# Patient Record
Sex: Male | Born: 1970 | Race: White | Hispanic: No | Marital: Married | Smoking: Never smoker
Health system: Southern US, Community
[De-identification: ages and names within clinical notes are randomized; demographics above are authoritative.]

---

## 1998-07-10 ENCOUNTER — Emergency Department (HOSPITAL_COMMUNITY): Admission: EM | Admit: 1998-07-10 | Discharge: 1998-07-11 | Payer: Self-pay | Admitting: Emergency Medicine

## 2013-08-20 ENCOUNTER — Encounter: Payer: Self-pay | Admitting: Family Medicine

## 2013-08-20 ENCOUNTER — Ambulatory Visit (INDEPENDENT_AMBULATORY_CARE_PROVIDER_SITE_OTHER): Payer: Managed Care, Other (non HMO) | Admitting: Family Medicine

## 2013-08-20 VITALS — BP 116/72 | HR 58 | Temp 98.4°F | Ht 70.75 in | Wt 166.5 lb

## 2013-08-20 DIAGNOSIS — Z119 Encounter for screening for infectious and parasitic diseases, unspecified: Secondary | ICD-10-CM

## 2013-08-20 DIAGNOSIS — Z0000 Encounter for general adult medical examination without abnormal findings: Secondary | ICD-10-CM

## 2013-08-20 NOTE — Patient Instructions (Addendum)
Labs today  Stay healthy- take care of yourself  Consider a flu shot this fall

## 2013-08-20 NOTE — Progress Notes (Signed)
  Subjective:    Patient ID: Luis Richard, male    DOB: [birth year], 42 y.o.   MRN: [id number]  HPI Here to get established as a new patient  Lives in [place] -   Needs a PE for adoption of a child In the process of adoption from [place]  Is upgrading to a new form   In the process   Non smoker Healthy diet  Some exercise  Works at [employer]   Last Tdap 07  Declines flu shot    No ongoing health problems   Cholesterol profile 2011 LDL 88, HDL 53 - very good profile   Patient Active Problem List   Diagnosis Date Noted  . Routine general medical examination at a health care facility 08/20/2013  . Screening examination for infectious disease 08/20/2013   No past medical history on file. No past surgical history on file. History  Substance Use Topics  . Smoking status: Never Smoker   . Smokeless tobacco: Not on file  . Alcohol Use: Yes     Comment: occ   Family History  Problem Relation Age of Onset  . Arthritis Maternal Grandmother   . Arthritis Paternal Grandmother   . Hyperlipidemia Other     thinks both sets had it but not sure  . Diabetes Other     a few relatives   No Known Allergies No current outpatient prescriptions on file prior to visit.   No current facility-administered medications on file prior to visit.    Review of Systems Review of Systems  Constitutional: Negative for fever, appetite change, fatigue and unexpected weight change.  Eyes: Negative for pain and visual disturbance.  Respiratory: Negative for cough and shortness of breath.   Cardiovascular: Negative for cp or palpitations    Gastrointestinal: Negative for nausea, diarrhea and constipation.  Genitourinary: Negative for urgency and frequency.  Skin: Negative for pallor or rash   Neurological: Negative for weakness, light-headedness, numbness and headaches.  Hematological: Negative for adenopathy. Does not bruise/bleed easily.   Psychiatric/Behavioral: Negative for dysphoric mood. The patient is not nervous/anxious.         Objective:   Physical Exam  Constitutional: He appears well-developed and well-nourished. No distress.  HENT:  Head: Normocephalic and atraumatic.  Right Ear: External ear normal.  Left Ear: External ear normal.  Nose: Nose normal.  Mouth/Throat: Oropharynx is clear and moist.  Eyes: Conjunctivae and EOM are normal. Pupils are equal, round, and reactive to light. Right eye exhibits no discharge. Left eye exhibits no discharge. No scleral icterus.  Neck: Normal range of motion. Neck supple. No JVD present. Carotid bruit is not present. No thyromegaly present.  Cardiovascular: Normal rate, regular rhythm, normal heart sounds and intact distal pulses.  Exam reveals no gallop.   Pulmonary/Chest: Effort normal and breath sounds normal. No respiratory distress. He has no wheezes. He exhibits no tenderness.  Abdominal: Soft. Bowel sounds are normal. He exhibits no distension, no abdominal bruit and no mass. There is no tenderness.  Musculoskeletal: He exhibits no edema and no tenderness.  Lymphadenopathy:    He has no cervical adenopathy.  Neurological: He is alert. He has normal reflexes. No cranial nerve deficit. He exhibits normal muscle tone. Coordination normal.  Skin: Skin is warm and dry. No rash noted. No erythema. No pallor.  Psychiatric: He has a normal mood and affect.          Assessment & Plan:

## 2013-08-21 LAB — CBC WITH DIFFERENTIAL/PLATELET
Basophils Absolute: 0 10*3/uL (ref 0.0–0.1)
HCT: 44.2 % (ref 39.0–52.0)
Hemoglobin: 14.7 g/dL (ref 13.0–17.0)
Lymphs Abs: 1.6 10*3/uL (ref 0.7–4.0)
MCHC: 33.2 g/dL (ref 30.0–36.0)
MCV: 86.4 fl (ref 78.0–100.0)
Monocytes Absolute: 0.5 10*3/uL (ref 0.1–1.0)
Monocytes Relative: 8.5 % (ref 3.0–12.0)
Neutro Abs: 3.5 10*3/uL (ref 1.4–7.7)
RDW: 13 % (ref 11.5–14.6)

## 2013-08-21 NOTE — Assessment & Plan Note (Signed)
Required for overseas adoption program  Hep B and HIV

## 2013-08-21 NOTE — Assessment & Plan Note (Signed)
Reviewed health habits including diet and exercise and skin cancer prevention Also reviewed health mt list, fam hx and immunizations  Wellness labs today  Filled out form for overseas adoption program

## 2013-08-22 LAB — LIPID PANEL
HDL: 52.6 mg/dL (ref >39.00)
Total CHOL/HDL Ratio: 3
Triglycerides: 54 mg/dL (ref 0.0–149.0)

## 2013-08-22 LAB — COMPREHENSIVE METABOLIC PANEL
ALT: 16 U/L (ref 0–53)
CO2: 29 mEq/L (ref 19–32)
Creatinine, Ser: 1.1 mg/dL (ref 0.4–1.5)
GFR: 82.34 mL/min (ref >60.00)
Total Bilirubin: 0.8 mg/dL (ref 0.3–1.2)

## 2013-08-22 LAB — TSH: TSH: 1.93 u[IU]/mL (ref 0.35–5.50)

## 2013-08-30 ENCOUNTER — Encounter: Payer: Self-pay | Admitting: Family Medicine

## 2014-02-11 ENCOUNTER — Encounter: Payer: Self-pay | Admitting: Family Medicine

## 2014-03-10 ENCOUNTER — Telehealth: Payer: Self-pay | Admitting: Family Medicine

## 2014-03-10 NOTE — Telephone Encounter (Signed)
Pt also left vm on triage phone requesting the same as below.

## 2014-03-10 NOTE — Telephone Encounter (Signed)
Pt called and is trying to adopt a child from [place] of which he says you are aware.  He needs a Twin Rix (Hep A & B combo) vaccine and wants to know if he could get that here at this office.  Please advise. Thank you.

## 2014-03-10 NOTE — Telephone Encounter (Signed)
[facility] do have [facility]rix[facility], appt scheduled for tomorrow

## 2014-03-10 NOTE — Telephone Encounter (Signed)
As long as we are allowed to give it to adults ([facility] that is fine - make a nurse appt

## 2014-03-11 ENCOUNTER — Ambulatory Visit (INDEPENDENT_AMBULATORY_CARE_PROVIDER_SITE_OTHER): Payer: Managed Care, Other (non HMO) | Admitting: *Deleted

## 2014-03-11 DIAGNOSIS — Z23 Encounter for immunization: Secondary | ICD-10-CM

## 2014-04-15 ENCOUNTER — Ambulatory Visit: Payer: Managed Care, Other (non HMO)

## 2014-06-05 ENCOUNTER — Telehealth: Payer: Self-pay

## 2014-06-05 NOTE — Telephone Encounter (Signed)
Per Geraldine Contrasee pt doesn't need to restart the series he can just get his next vaccine asap

## 2014-06-05 NOTE — Telephone Encounter (Signed)
I think he needs to start the series over - [facility]

## 2014-06-05 NOTE — Telephone Encounter (Signed)
Pt received first Twinrix 03/11/14 and pt missed appt for second twinrix. Pt wants to know if can get 2nd twinrix this month; should have had between 1-2 months after first immunization. Pt request cb.

## 2014-06-08 NOTE — Telephone Encounter (Signed)
Nurse appt scheduled for 2nd twinrix

## 2014-06-08 NOTE — Telephone Encounter (Signed)
Pt left v/m requesting cb about twinrix.

## 2014-06-11 ENCOUNTER — Ambulatory Visit (INDEPENDENT_AMBULATORY_CARE_PROVIDER_SITE_OTHER): Payer: Managed Care, Other (non HMO)

## 2014-06-11 DIAGNOSIS — Z23 Encounter for immunization: Secondary | ICD-10-CM

## 2014-09-28 ENCOUNTER — Emergency Department (INDEPENDENT_AMBULATORY_CARE_PROVIDER_SITE_OTHER)
Admission: EM | Admit: 2014-09-28 | Discharge: 2014-09-28 | Disposition: A | Discharge status: Home / Self Care | Payer: Worker's Compensation | Source: Home / Self Care

## 2014-09-28 ENCOUNTER — Encounter (HOSPITAL_COMMUNITY): Payer: Self-pay | Admitting: Emergency Medicine

## 2014-09-28 DIAGNOSIS — S0181XA Laceration without foreign body of other part of head, initial encounter: Secondary | ICD-10-CM

## 2014-09-28 MED ORDER — LIDOCAINE-EPINEPHRINE (PF) 2 %-1:200000 IJ SOLN
INTRAMUSCULAR | Status: AC
  Filled 2014-09-28: qty 20

## 2014-09-28 NOTE — ED Provider Notes (Signed)
CSN: [id number]     Arrival date & time 09/28/14  1853 History   First MD Initiated Contact with Patient 09/28/14 1933     Chief Complaint  Patient presents with  . Facial Laceration   (Consider location/radiation/quality/duration/timing/severity/associated sxs/prior Treatment) HPI Comments: Struck on L chin by a bolt at 2PM today. C/O laceration only. No problems with mandible function. No involvement to eyes, mouth, nose.   History reviewed. No pertinent past medical history. History reviewed. No pertinent past surgical history. Family History  Problem Relation Age of Onset  . Arthritis Maternal Grandmother   . Arthritis Paternal Grandmother   . Hyperlipidemia Other     thinks both sets had it but not sure  . Diabetes Other     a few relatives   History  Substance Use Topics  . Smoking status: Never Smoker   . Smokeless tobacco: Not on file  . Alcohol Use: No     Comment: occ    Review of Systems  Skin: Positive for wound.  All other systems reviewed and are negative.   Allergies  Review of patient's allergies indicates no known allergies.  Home Medications   Prior to Admission medications   Not on File   BP 149/79  Pulse 76  Temp(Src) 98.5 F (36.9 C) (Oral)  Resp 14  SpO2 100% Physical Exam  Nursing note and vitals reviewed. Constitutional: He is oriented to person, place, and time. He appears well-developed and well-nourished. No distress.  Neck:    Laceration diagram.  Musculoskeletal: Normal range of motion. He exhibits no edema.  Neurological: He is alert and oriented to person, place, and time. He exhibits normal muscle tone.  Skin: Skin is warm and dry.  Superficial laceration to right side of chin, angled, with a total length of 1 cm.  Psychiatric: He has a normal mood and affect.    ED Course  LACERATION REPAIR Date/Time: 09/28/2014 8:06 PM Performed by: Phineas RealMABE, Manan Olmo Authorized by: Clementeen GrahamOREY, EVAN, S Consent: Verbal consent obtained. Consent  given by: patient Patient understanding: patient states understanding of the procedure being performed Patient identity confirmed: verbally with patient Body area: head/neck Location details: right cheek Laceration length: 1 cm Foreign bodies: no foreign bodies Tendon involvement: none Nerve involvement: none Vascular damage: no Anesthesia: local infiltration Local anesthetic: lidocaine 2% with epinephrine Anesthetic total: 3 ml Patient sedated: no Preparation: Patient was prepped and draped in the usual sterile fashion. Irrigation solution: saline Irrigation method: syringe Amount of cleaning: standard Debridement: none Degree of undermining: none Skin closure: 5-0 Prolene Technique: simple Approximation: close Approximation difficulty: simple   (including critical care time) Labs Review Labs Reviewed - No data to display  Imaging Review No results found.   MDM   1. Laceration of face without complication, initial encounter    Laceration repair as above RTO 5 d for suture removal, sooner for problems, infection    Hayden Rasmussenavid Uchechi Denison, NP 09/28/14 2009

## 2014-09-28 NOTE — ED Notes (Addendum)
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 Has aprox 1 x 2 "L" shaped laceration , no other injury . Ate meal after injury and was able to chew . Denies LOC. Bleeding controlled. States his last tetanus was ~3 years ago

## 2014-09-28 NOTE — Discharge Instructions (Signed)
Facial Laceration ° A facial laceration is a cut on the face. These injuries can be painful and cause bleeding. Lacerations usually heal quickly, but they need special care to reduce scarring. °DIAGNOSIS  °Your health care provider will take a medical history, ask for details about how the injury occurred, and examine the wound to determine how deep the cut is. °TREATMENT  °Some facial lacerations may not require closure. Others may not be able to be closed because of an increased risk of infection. The risk of infection and the chance for successful closure will depend on various factors, including the amount of time since the injury occurred. °The wound may be cleaned to help prevent infection. If closure is appropriate, pain medicines may be given if needed. Your health care provider will use stitches (sutures), wound glue (adhesive), or skin adhesive strips to repair the laceration. These tools bring the skin edges together to allow for faster healing and a better cosmetic outcome. If needed, you may also be given a tetanus shot. °HOME CARE INSTRUCTIONS °· Only take over-the-counter or prescription medicines as directed by your health care provider. °· Follow your health care provider's instructions for wound care. These instructions will vary depending on the technique used for closing the wound. °For Sutures: °· Keep the wound clean and dry.   °· If you were given a bandage (dressing), you should change it at least once a day. Also change the dressing if it becomes wet or dirty, or as directed by your health care provider.   °· Wash the wound with soap and water 2 times a day. Rinse the wound off with water to remove all soap. Pat the wound dry with a clean towel.   °· After cleaning, apply a thin layer of the antibiotic ointment recommended by your health care provider. This will help prevent infection and keep the dressing from sticking.   °· You may shower as usual after the first 24 hours. Do not soak the  wound in water until the sutures are removed.   °· Get your sutures removed as directed by your health care provider. With facial lacerations, sutures should usually be taken out after 4-5 days to avoid stitch marks.   °· Wait a few days after your sutures are removed before applying any makeup. °For Skin Adhesive Strips: °· Keep the wound clean and dry.   °· Do not get the skin adhesive strips wet. You may bathe carefully, using caution to keep the wound dry.   °· If the wound gets wet, pat it dry with a clean towel.   °· Skin adhesive strips will fall off on their own. You may trim the strips as the wound heals. Do not remove skin adhesive strips that are still stuck to the wound. They will fall off in time.   °For Wound Adhesive: °· You may briefly wet your wound in the shower or bath. Do not soak or scrub the wound. Do not swim. Avoid periods of heavy sweating until the skin adhesive has fallen off on its own. After showering or bathing, gently pat the wound dry with a clean towel.   °· Do not apply liquid medicine, cream medicine, ointment medicine, or makeup to your wound while the skin adhesive is in place. This may loosen the film before your wound is healed.   °· If a dressing is placed over the wound, be careful not to apply tape directly over the skin adhesive. This may cause the adhesive to be pulled off before the wound is healed.   °· Avoid   prolonged exposure to sunlight or tanning lamps while the skin adhesive is in place.  The skin adhesive will usually remain in place for 5-10 days, then naturally fall off the skin. Do not pick at the adhesive film.  After Healing: Once the wound has healed, cover the wound with sunscreen during the day for 1 full year. This can help minimize scarring. Exposure to ultraviolet light in the first year will darken the scar. It can take 1-2 years for the scar to lose its redness and to heal completely.  SEEK IMMEDIATE MEDICAL CARE IF:  You have redness, pain, or  swelling around the wound.   You see ayellowish-white fluid (pus) coming from the wound.   You have chills or a fever.  MAKE SURE YOU:  Understand these instructions.  Will watch your condition.  Will get help right away if you are not doing well or get worse. Document Released: 01/18/2005 Document Revised: 10/01/2013 Document Reviewed: 07/24/2013 [facility] Patient Information 2015 [place] [place]. This information is not intended to replace advice given to you by your health care provider. Make sure you discuss any questions you have with your health care provider.  Laceration Care, Adult A laceration is a cut that goes through all layers of the skin. The cut goes into the tissue beneath the skin. HOME CARE For stitches (sutures) or staples:  Keep the cut clean and dry.  If you have a bandage (dressing), change it at least once a day. Change the bandage if it gets wet or dirty, or as told by your doctor.  Wash the cut with soap and water 2 times a day. Rinse the cut with water. Pat it dry with a clean towel.  Put a thin layer of medicated cream on the cut as told by your doctor.  You may shower after the first 24 hours. Do not soak the cut in water until the stitches are removed.  Only take medicines as told by your doctor.  Have your stitches or staples removed as told by your doctor. For skin adhesive strips:  Keep the cut clean and dry.  Do not get the strips wet. You may take a bath, but be careful to keep the cut dry.  If the cut gets wet, pat it dry with a clean towel.  The strips will fall off on their own. Do not remove the strips that are still stuck to the cut. For wound glue:  You may shower or take baths. Do not soak or scrub the cut. Do not swim. Avoid heavy sweating until the glue falls off on its own. After a shower or bath, pat the cut dry with a clean towel.  Do not put medicine on your cut until the glue falls off.  If you have a bandage, do not  put tape over the glue.  Avoid lots of sunlight or tanning lamps until the glue falls off. Put sunscreen on the cut for the first year to reduce your scar.  The glue will fall off on its own. Do not pick at the glue. You may need a tetanus shot if:  You cannot remember when you had your last tetanus shot.  You have never had a tetanus shot. If you need a tetanus shot and you choose not to have one, you may get tetanus. Sickness from tetanus can be serious. GET HELP RIGHT AWAY IF:   Your pain does not get better with medicine.  Your arm, hand, leg, or foot loses  feeling (numbness) or changes color.  Your cut is bleeding.  Your joint feels weak, or you cannot use your joint.  You have painful lumps on your body.  Your cut is red, puffy (swollen), or painful.  You have a red line on the skin near the cut.  You have yellowish-white fluid (pus) coming from the cut.  You have a fever.  You have a bad smell coming from the cut or bandage.  Your cut breaks open before or after stitches are removed.  You notice something coming out of the cut, such as wood or glass.  You cannot move a finger or toe. MAKE SURE YOU:   Understand these instructions.  Will watch your condition.  Will get help right away if you are not doing well or get worse. Document Released: 05/29/2008 Document Revised: 03/04/2012 Document Reviewed: 06/06/2011 [facility] Patient Information 2015 [place], [place]. This information is not intended to replace advice given to you by your health care provider. Make sure you discuss any questions you have with your health care provider.

## 2014-09-30 NOTE — ED Provider Notes (Signed)
Medical screening examination/treatment/procedure(s) were performed by a resident physician or non-physician practitioner and as the supervising physician I was immediately available for consultation/collaboration.  Zhyon Antenucci, MD    Sahid Borba S Lavante Toso, MD 09/30/14 0757 

## 2014-10-03 ENCOUNTER — Emergency Department (INDEPENDENT_AMBULATORY_CARE_PROVIDER_SITE_OTHER)
Admission: EM | Admit: 2014-10-03 | Discharge: 2014-10-03 | Disposition: A | Discharge status: Home / Self Care | Payer: Worker's Compensation | Source: Home / Self Care

## 2014-10-03 ENCOUNTER — Encounter (HOSPITAL_COMMUNITY): Payer: Self-pay | Admitting: Emergency Medicine

## 2014-10-03 DIAGNOSIS — Z4802 Encounter for removal of sutures: Secondary | ICD-10-CM

## 2014-10-03 DIAGNOSIS — IMO0002 Reserved for concepts with insufficient information to code with codable children: Secondary | ICD-10-CM

## 2014-10-03 NOTE — Discharge Instructions (Signed)
Wash with soap and water and apply antibiotic ointment and may use Mederma or Cocoa Butter or Shea butter.

## 2014-10-03 NOTE — ED Notes (Signed)
Patient is here for suture removal from right chin area.  sutures intact, no redness, no drainage.  Well  Healed.

## 2014-10-03 NOTE — ED Provider Notes (Signed)
  Chief Complaint   Suture / Staple Removal   History of Present Illness   Luis Richard is a 43 year old male who returns today for suture removal. He was seen here 5 days ago after a laceration sustained to the right side of the chin. He was struck by a bolt. This was repaired with 4 Prolene sutures. The wound has been healing well he returns today for suture removal.  Review of Systems   Other than as noted above, the patient denies any of the following symptoms: Systemic:  No fevers, chills, sweats, weight loss or gain, fatigue, or tiredness.  PMFSH   Past medical history, family history, social history, meds, and allergies were reviewed.    Physical Examination    Vital signs:  BP 92/57  Pulse 59  Temp(Src) 97.6 F (36.4 C) (Oral) General:  Alert and oriented.  In no distress.  Skin warm and dry. HEENT: There is a 1 cm laceration to the right chin that is close with 4 Prolene sutures and is healing well without evidence of infection.   Course in Urgent Care Center   The wound was prepped with alcohol and the 4 sutures were removed.  Assessment   The encounter diagnosis was Laceration.  No evidence of infection.  Plan   1.  Meds:  The following meds were prescribed:   New Prescriptions   No medications on file    2.  Patient Education/Counseling:  The patient was given appropriate handouts, self care instructions, and instructed in symptomatic relief.  The patient was instructed in wound care.  3.  Follow up:  The patient was told to follow up here if needed, and given some red flag symptoms such as any evidence of infection which would prompt immediate return.        Reuben Likesavid C Yareth Macdonnell, MD 10/03/14 [phone]

## 2015-02-14 ENCOUNTER — Emergency Department (INDEPENDENT_AMBULATORY_CARE_PROVIDER_SITE_OTHER): Payer: Managed Care, Other (non HMO)

## 2015-02-14 ENCOUNTER — Encounter (HOSPITAL_COMMUNITY): Payer: Self-pay | Admitting: *Deleted

## 2015-02-14 ENCOUNTER — Emergency Department (INDEPENDENT_AMBULATORY_CARE_PROVIDER_SITE_OTHER)
Admission: EM | Admit: 2015-02-14 | Discharge: 2015-02-14 | Disposition: A | Discharge status: Home / Self Care | Payer: Managed Care, Other (non HMO) | Source: Home / Self Care | Attending: Family Medicine | Admitting: Family Medicine

## 2015-02-14 DIAGNOSIS — J069 Acute upper respiratory infection, unspecified: Secondary | ICD-10-CM

## 2015-02-14 NOTE — Discharge Instructions (Signed)
Thank you for coming in today. Stop Clathromycin.  Take up to 2 aleve twice daily as needed.  Call or go to the emergency room if you get worse, have trouble breathing, have chest pains, or palpitations.    Upper Respiratory Infection, Adult An upper respiratory infection (URI) is also sometimes known as the common cold. The upper respiratory tract includes the nose, sinuses, throat, trachea, and bronchi. Bronchi are the airways leading to the lungs. Most people improve within 1 week, but symptoms can last up to 2 weeks. A residual cough may last even longer.  CAUSES Many different viruses can infect the tissues lining the upper respiratory tract. The tissues become irritated and inflamed and often become very moist. Mucus production is also common. A cold is contagious. You can easily spread the virus to others by oral contact. This includes kissing, sharing a glass, coughing, or sneezing. Touching your mouth or nose and then touching a surface, which is then touched by another person, can also spread the virus. SYMPTOMS  Symptoms typically develop 1 to 3 days after you come in contact with a cold virus. Symptoms vary from person to person. They may include:  Runny nose.  Sneezing.  Nasal congestion.  Sinus irritation.  Sore throat.  Loss of voice (laryngitis).  Cough.  Fatigue.  Muscle aches.  Loss of appetite.  Headache.  Low-grade fever. DIAGNOSIS  You might diagnose your own cold based on familiar symptoms, since most people get a cold 2 to 3 times a year. Your caregiver can confirm this based on your exam. Most importantly, your caregiver can check that your symptoms are not due to another disease such as strep throat, sinusitis, pneumonia, asthma, or epiglottitis. Blood tests, throat tests, and X-rays are not necessary to diagnose a common cold, but they may sometimes be helpful in excluding other more serious diseases. Your caregiver will decide if any further tests are  required. RISKS AND COMPLICATIONS  You may be at risk for a more severe case of the common cold if you smoke cigarettes, have chronic heart disease (such as heart failure) or lung disease (such as asthma), or if you have a weakened immune system. The very young and very old are also at risk for more serious infections. Bacterial sinusitis, middle ear infections, and bacterial pneumonia can complicate the common cold. The common cold can worsen asthma and chronic obstructive pulmonary disease (COPD). Sometimes, these complications can require emergency medical care and may be life-threatening. PREVENTION  The best way to protect against getting a cold is to practice good hygiene. Avoid oral or hand contact with people with cold symptoms. Wash your hands often if contact occurs. There is no clear evidence that vitamin C, vitamin E, echinacea, or exercise reduces the chance of developing a cold. However, it is always recommended to get plenty of rest and practice good nutrition. TREATMENT  Treatment is directed at relieving symptoms. There is no cure. Antibiotics are not effective, because the infection is caused by a virus, not by bacteria. Treatment may include:  Increased fluid intake. Sports drinks offer valuable electrolytes, sugars, and fluids.  Breathing heated mist or steam (vaporizer or shower).  Eating chicken soup or other clear broths, and maintaining good nutrition.  Getting plenty of rest.  Using gargles or lozenges for comfort.  Controlling fevers with ibuprofen or acetaminophen as directed by your caregiver.  Increasing usage of your inhaler if you have asthma. Zinc gel and zinc lozenges, taken in the first 24  hours of the common cold, can shorten the duration and lessen the severity of symptoms. Pain medicines may help with fever, muscle aches, and throat pain. A variety of non-prescription medicines are available to treat congestion and runny nose. Your caregiver can make  recommendations and may suggest nasal or lung inhalers for other symptoms.  HOME CARE INSTRUCTIONS   Only take over-the-counter or prescription medicines for pain, discomfort, or fever as directed by your caregiver.  Use a warm mist humidifier or inhale steam from a shower to increase air moisture. This may keep secretions moist and make it easier to breathe.  Drink enough water and fluids to keep your urine clear or pale yellow.  Rest as needed.  Return to work when your temperature has returned to normal or as your caregiver advises. You may need to stay home longer to avoid infecting others. You can also use a face mask and careful hand washing to prevent spread of the virus. SEEK MEDICAL CARE IF:   After the first few days, you feel you are getting worse rather than better.  You need your caregiver's advice about medicines to control symptoms.  You develop chills, worsening shortness of breath, or brown or red sputum. These may be signs of pneumonia.  You develop yellow or brown nasal discharge or pain in the face, especially when you bend forward. These may be signs of sinusitis.  You develop a fever, swollen neck glands, pain with swallowing, or white areas in the back of your throat. These may be signs of strep throat. SEEK IMMEDIATE MEDICAL CARE IF:   You have a fever.  You develop severe or persistent headache, ear pain, sinus pain, or chest pain.  You develop wheezing, a prolonged cough, cough up blood, or have a change in your usual mucus (if you have chronic lung disease).  You develop sore muscles or a stiff neck. Document Released: 06/06/2001 Document Revised: 03/04/2012 Document Reviewed: 03/18/2014 [facility] Patient Information 2015 [place], [place]. This information is not intended to replace advice given to you by your health care provider. Make sure you discuss any questions you have with your health care provider.

## 2015-02-14 NOTE — ED Provider Notes (Signed)
Luis Richard is a 44 y.o. male who presents to Urgent Care today for cough and fever present for one week. Patient called a E doctor who prescribed clarithromycin and benzoate. He is now better. He continues to have subjective fevers at home. His temperature max is 100F. He has tried also Tylenol and ibuprofen which helps a little. No vomiting or diarrhea.   History reviewed. No pertinent past medical history. History reviewed. No pertinent past surgical history. History  Substance Use Topics  . Smoking status: Never Smoker   . Smokeless tobacco: Not on file  . Alcohol Use: Yes     Comment: occasional   ROS as above Medications: No current facility-administered medications for this encounter.   Current Outpatient Prescriptions  Medication Sig Dispense Refill  . benzonatate (TESSALON) 100 MG capsule Take by mouth 3 (three) times daily as needed for cough.     No Known Allergies   Exam:  BP 106/60 mmHg  Pulse 80  Temp(Src) 98.7 F (37.1 C) (Oral)  Resp 18  SpO2 98% Gen: Well NAD HEENT: EOMI,  MMM  posterior pharynx is normal as are tympanic membranes bilaterally  Lungs: Normal work of breathing Decreased breath sounds right lung compared to left. Heart: RRR no MRG Abd: NABS, Soft. Nondistended, Nontender Exts: Brisk capillary refill, warm and well perfused.   No results found for this or any previous visit (from the past 24 hour(s)). Dg Chest 2 View  02/14/2015   CLINICAL DATA:  Cough and fever.  Taking antibiotics.  EXAM: CHEST  2 VIEW  COMPARISON:  None.  FINDINGS: Normal sized heart.  Clear lungs.  Mild to moderate scoliosis.  IMPRESSION: No acute abnormality.   Electronically Signed   By: Beckie SaltsSteven  Reid M.D.   On: 02/14/2015 10:50    Assessment and Plan: 44 y.o. male with  Viral URI. Patient is completely afebrile with minimal respiratory complaints. Plan for observation and watchful waiting with Tylenol or ibuprofen. Return as needed.  Discussed warning signs or  symptoms. Please see discharge instructions. Patient expresses understanding.     Rodolph BongEvan S Takiera Mayo, MD 02/14/15 [phone]

## 2015-02-14 NOTE — ED Notes (Signed)
Assessment per Dr. Denyse Amassorey.  Has been taking IBU, acetaminophen, and clarithromycin.

## 2015-10-08 ENCOUNTER — Encounter: Payer: Self-pay | Admitting: Internal Medicine

## 2015-10-08 ENCOUNTER — Ambulatory Visit (INDEPENDENT_AMBULATORY_CARE_PROVIDER_SITE_OTHER): Payer: Managed Care, Other (non HMO) | Admitting: Internal Medicine

## 2015-10-08 VITALS — BP 120/80 | HR 73 | Temp 97.8°F | Wt 171.0 lb

## 2015-10-08 DIAGNOSIS — J039 Acute tonsillitis, unspecified: Secondary | ICD-10-CM | POA: Diagnosis not present

## 2015-10-08 LAB — POCT RAPID STREP A (OFFICE): RAPID STREP A SCREEN: NEGATIVE

## 2015-10-08 MED ORDER — PENICILLIN V POTASSIUM 500 MG PO TABS: 500.0000 mg | ORAL_TABLET | Freq: Two times a day (BID) | ORAL | Status: DC

## 2015-10-08 NOTE — Assessment & Plan Note (Addendum)
High risk for strep despite no known exposure Rapid test negative Will treat empirically with PCN and send culture (stop if negative)

## 2015-10-08 NOTE — Addendum Note (Signed)
Addended by: Sueanne MargaritaSMITH, Mychael Smock L on: 10/08/2015 08:47 AM   Modules accepted: Orders

## 2015-10-08 NOTE — Progress Notes (Signed)
   Subjective:    Patient ID: Cathrine MusterAndrew A Enns, male    DOB: January 31, 1971, 44 y.o.   MRN: [id number]  HPI Here due to sore throat  Noticed sneezing at work about 4 days ago Started zycam Wife thought it was allergies Noticed white spots in throat yesterday Some trouble swallowing  No fever No cough No sig nasal congestion Does have some post nasal drip though Some ear pain  Using zinc melts. Taking antihistamines--?some help Ibuprofen also  No current outpatient prescriptions on file prior to visit.   No current facility-administered medications on file prior to visit.    No Known Allergies  No past medical history on file.  No past surgical history on file.  Family History  Problem Relation Age of Onset  . Arthritis Maternal Grandmother   . Arthritis Paternal Grandmother   . Hyperlipidemia Other     thinks both sets had it but not sure  . Diabetes Other     a few relatives    Social History   Social History  . Marital Status: Married    Spouse Name: N/A  . Number of Children: N/A  . Years of Education: N/A   Occupational History  . Not on file.   Social History Main Topics  . Smoking status: Never Smoker   . Smokeless tobacco: Never Used  . Alcohol Use: 0.0 oz/week    0 Standard drinks or equivalent per week     Comment: occasional  . Drug Use: No  . Sexual Activity: Not on file   Other Topics Concern  . 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 Some mouth symptoms    Objective:   Physical Exam  Constitutional: He appears well-developed and well-nourished. No distress.  HENT:  Nose: Nose normal.  No sinus tenderness TMs normal Tonsils 1+ with injection and exudates. Palatal petechiae noted  Neck: Normal range of motion. Neck supple.  Non tender left anterior cervical node  Pulmonary/Chest: Effort normal and breath sounds normal. No  respiratory distress. He has no wheezes. He has no rales.  Skin: No rash noted.          Assessment & Plan:

## 2015-10-08 NOTE — Progress Notes (Signed)
Pre visit review using our clinic review tool, if applicable. No additional management support is needed unless otherwise documented below in the visit note. 

## 2015-10-10 LAB — CULTURE, GROUP A STREP: ORGANISM ID, BACTERIA: NORMAL

## 2015-12-11 IMAGING — DX DG CHEST 2V
3 series · 3 of 3 positions shown · non-contrast
Comparison: None.

CLINICAL DATA: Cough and fever.  Taking antibiotics.

EXAM:
CHEST  2 VIEW

[chest pa]
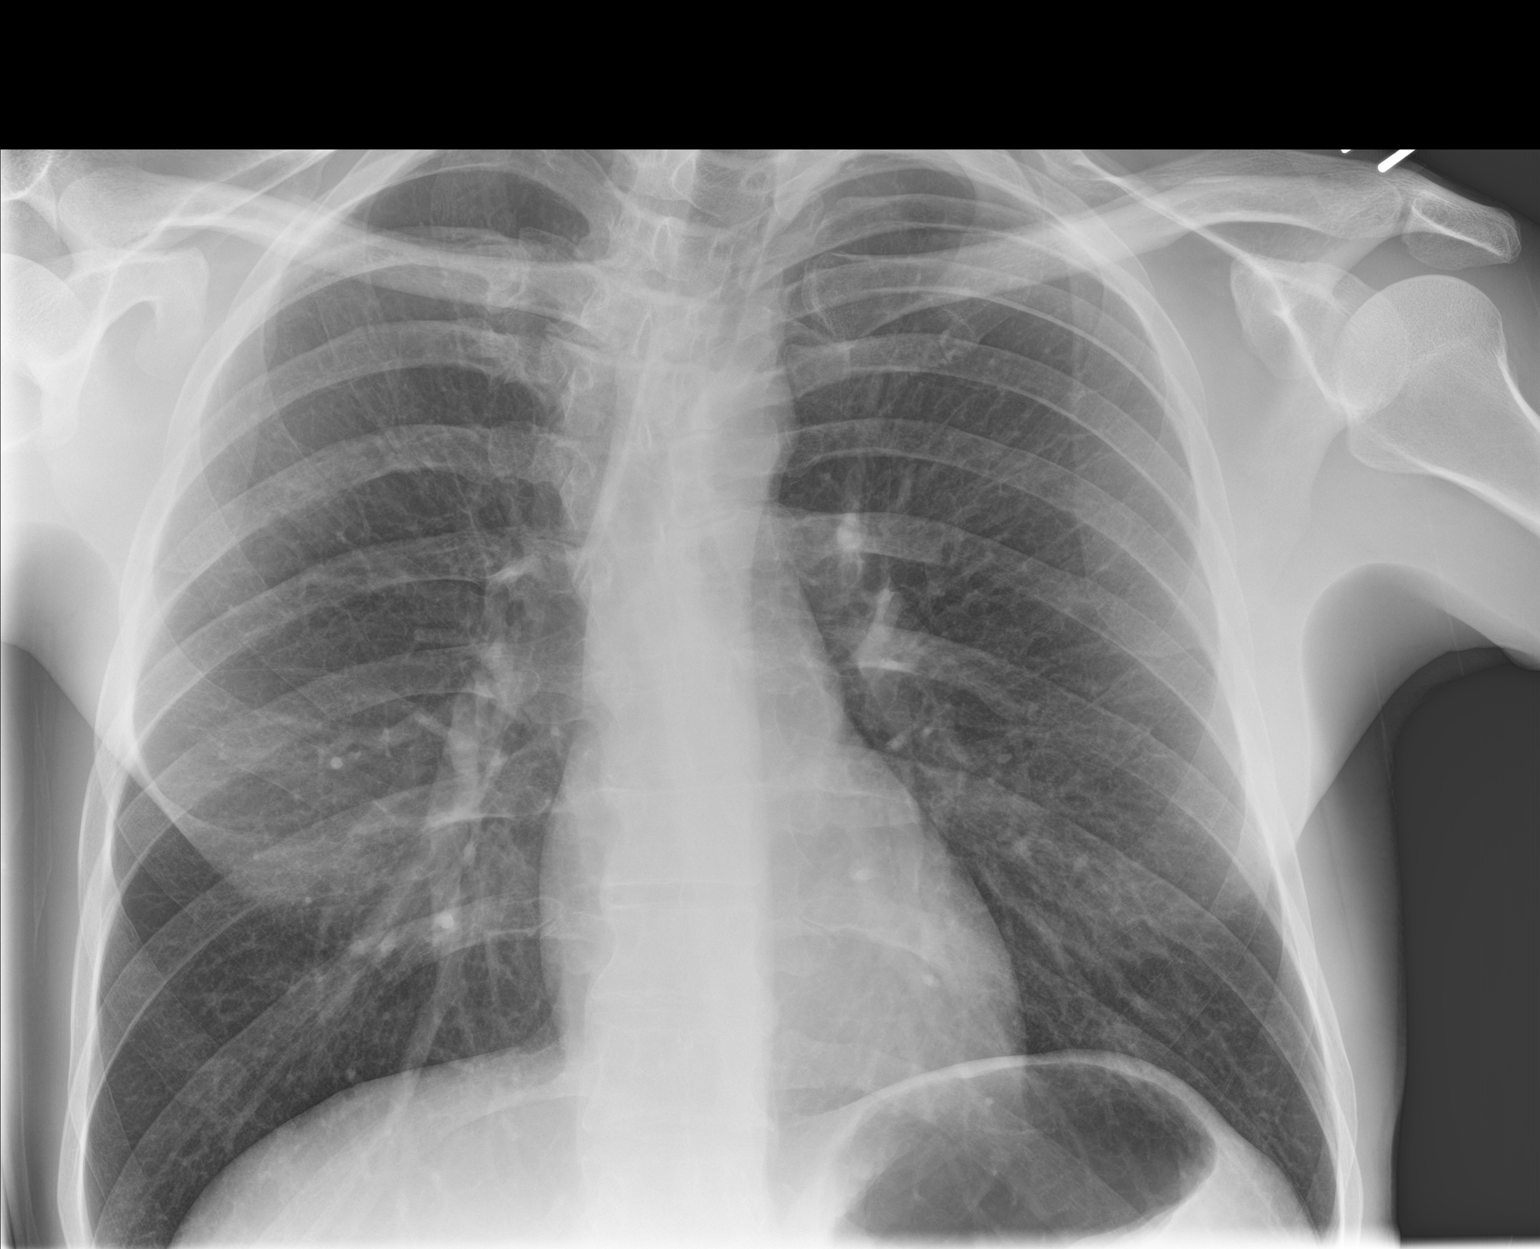

[chest lat]
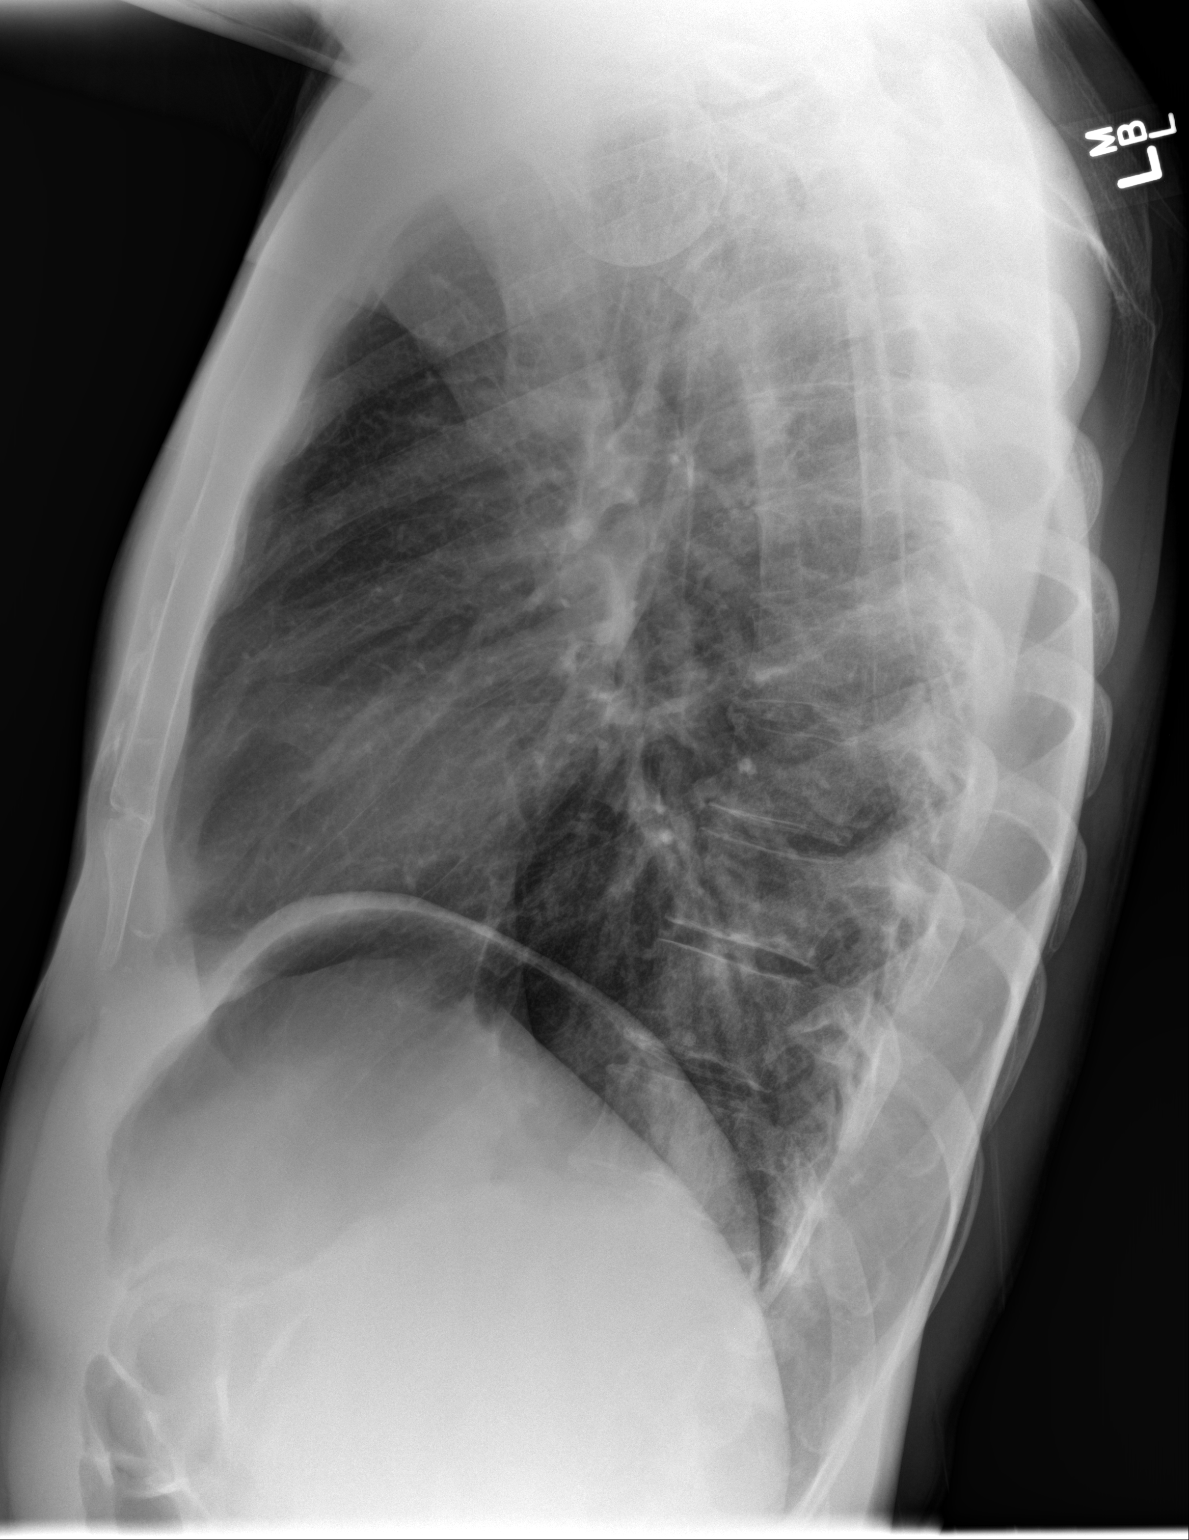

[chest ap]
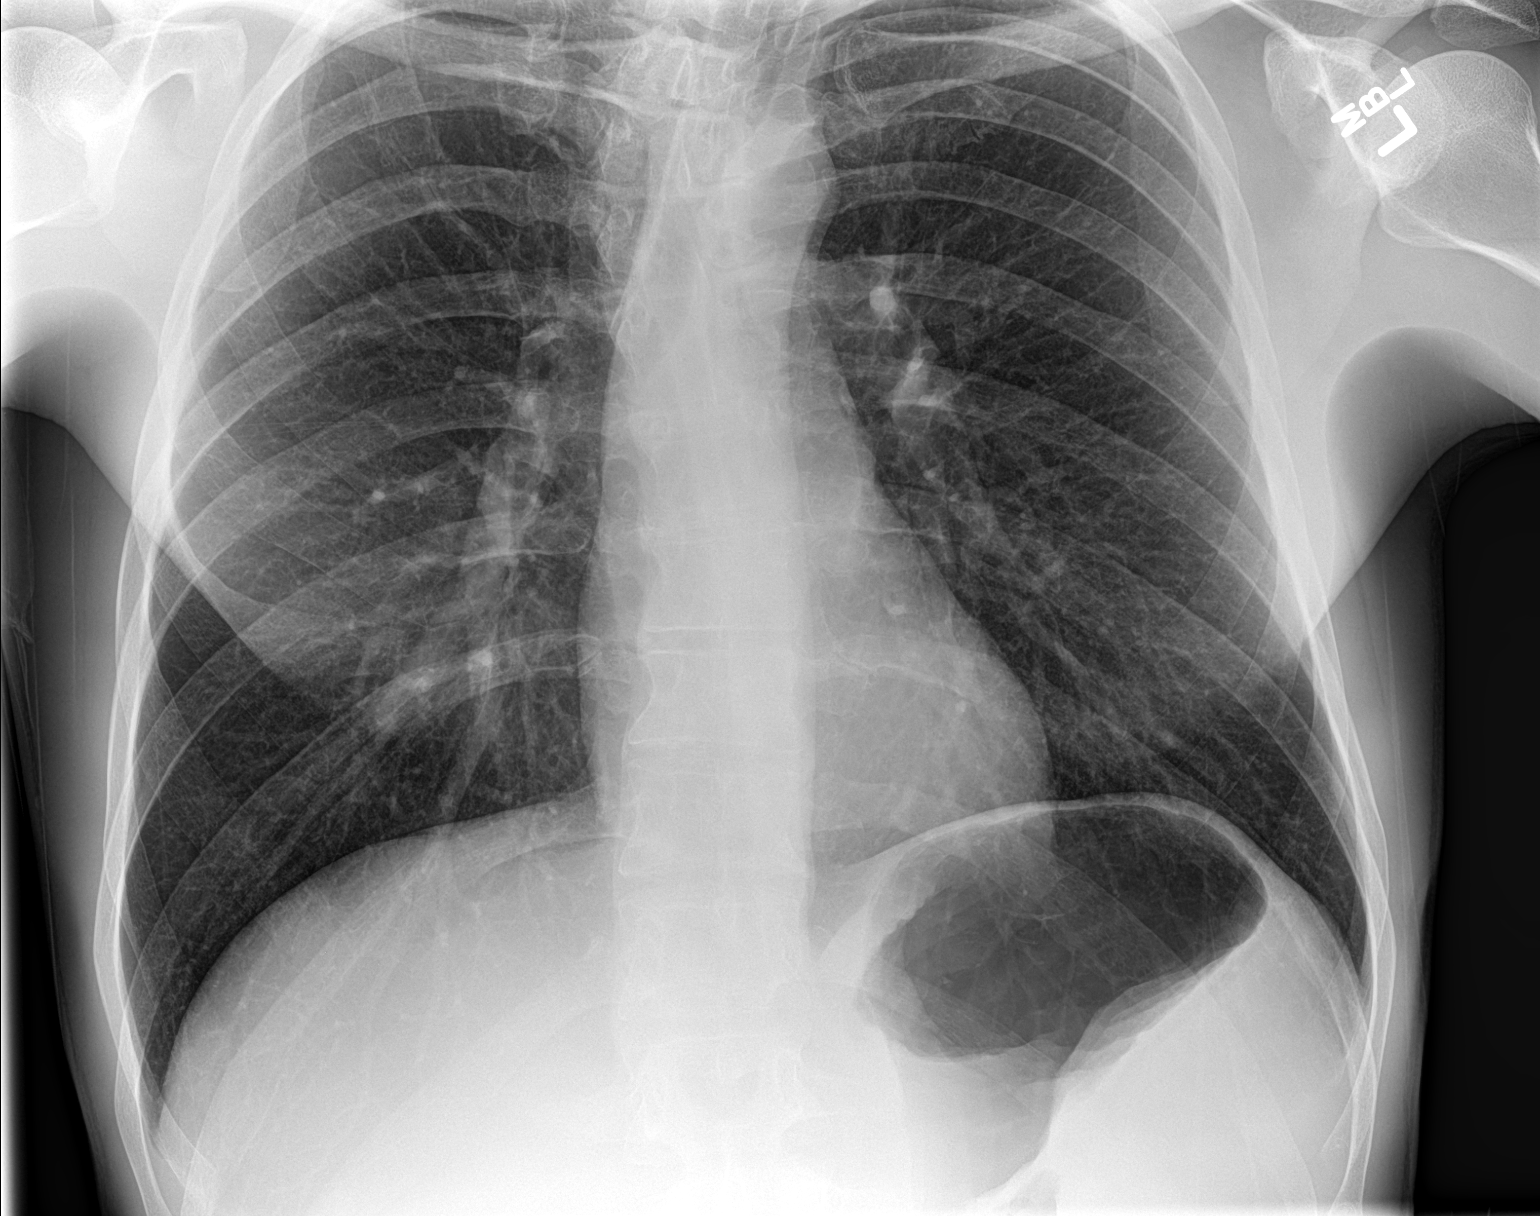

[3 of 3 positions shown; findings below may reference images not displayed]

FINDINGS: Normal sized heart.  Clear lungs.  Mild to moderate scoliosis.
IMPRESSION: No acute abnormality.

## 2016-06-23 ENCOUNTER — Encounter: Payer: Self-pay | Admitting: Primary Care

## 2016-06-23 ENCOUNTER — Ambulatory Visit (INDEPENDENT_AMBULATORY_CARE_PROVIDER_SITE_OTHER): Payer: Managed Care, Other (non HMO) | Admitting: Primary Care

## 2016-06-23 VITALS — BP 118/74 | HR 57 | Temp 98.3°F | Ht 70.75 in | Wt 175.0 lb

## 2016-06-23 DIAGNOSIS — Z13220 Encounter for screening for lipoid disorders: Secondary | ICD-10-CM

## 2016-06-23 DIAGNOSIS — Z0000 Encounter for general adult medical examination without abnormal findings: Secondary | ICD-10-CM

## 2016-06-23 LAB — COMPREHENSIVE METABOLIC PANEL
ALBUMIN: 4.3 g/dL (ref 3.5–5.2)
ALK PHOS: 45 U/L (ref 39–117)
ALT: 15 U/L (ref 0–53)
AST: 18 U/L (ref 0–37)
BILIRUBIN TOTAL: 0.9 mg/dL (ref 0.2–1.2)
BUN: 16 mg/dL (ref 6–23)
CALCIUM: 9.5 mg/dL (ref 8.4–10.5)
CO2: 33 mEq/L — ABNORMAL HIGH (ref 19–32)
Chloride: 102 mEq/L (ref 96–112)
Creatinine, Ser: 1.1 mg/dL (ref 0.40–1.50)
GFR: 77.8 mL/min (ref >60.00)
Glucose, Bld: 92 mg/dL (ref 70–99)
Potassium: 3.7 mEq/L (ref 3.5–5.1)
Sodium: 139 mEq/L (ref 135–145)
TOTAL PROTEIN: 7.3 g/dL (ref 6.0–8.3)

## 2016-06-23 LAB — LIPID PANEL
CHOLESTEROL: 153 mg/dL (ref 0–200)
HDL: 48.5 mg/dL (ref >39.00)
LDL Cholesterol: 88 mg/dL (ref 0–99)
NONHDL: 104.19
TRIGLYCERIDES: 79 mg/dL (ref 0.0–149.0)
Total CHOL/HDL Ratio: 3
VLDL: 15.8 mg/dL (ref 0.0–40.0)

## 2016-06-23 NOTE — Progress Notes (Signed)
Subjective:    Patient ID: Luis Richard, male    DOB: 08-25-1971, 45 y.o.   MRN: [id number]  HPI  Luis Richard is a [age] year old male who presents today for complete physical.  Immunizations: -Tetanus: Unsure, believes it's been within 10 years as he traveled to [place] in 2014. -Influenza: Did not complete last season.    Diet: He endorses a healthy diet. Breakfast: Oatmeal, juice, english muffin, bagel Lunch: Sandwiches, left overs Dinner: Meat, vegetable, potatoes, pasta Snacks: Occasionally, fruit, granola bar Desserts: Nightly Beverages: Orange juice, water, occasional beer  Exercise: He does not currently exercise Eye exam: Completed in 2014 Dental exam: Completed in June 2017   Review of Systems  Constitutional: Negative for unexpected weight change.  HENT: Negative for rhinorrhea.   Respiratory: Negative for cough and shortness of breath.   Cardiovascular: Negative for chest pain.  Gastrointestinal: Negative for diarrhea and constipation.  Genitourinary: Negative for difficulty urinating.       No rashes or tenderness to scrotum.  Musculoskeletal: Negative for myalgias and arthralgias.  Skin: Negative for rash.  Allergic/Immunologic: Positive for environmental allergies.  Neurological: Negative for dizziness, numbness and headaches.  Psychiatric/Behavioral:       Denies concerns for anxiety or depression       No past medical history on file.   Social History   Social History  . Marital Status: Married    Spouse Name: N/A  . Number of Children: N/A  . Years of Education: N/A   Occupational History  . Not on file.   Social History Main Topics  . Smoking status: Never Smoker   . Smokeless tobacco: Never Used  . Alcohol Use: 0.0 oz/week    0 Standard drinks or equivalent per week     Comment: occasional  . Drug Use: No  . Sexual Activity: Not on file   Other Topics Concern  . Not on file   Social History Narrative    No past surgical history on  file.  Family History  Problem Relation Age of Onset  . Arthritis Maternal Grandmother   . Arthritis Paternal Grandmother   . Hyperlipidemia Other     thinks both sets had it but not sure  . Diabetes Other     a few relatives    No Known Allergies  No current outpatient prescriptions on file prior to visit.   No current facility-administered medications on file prior to visit.    BP 118/74 mmHg  Pulse 57  Temp(Src) 98.3 F (36.8 C) (Oral)  Ht 5' 10.75" (1.797 m)  Wt 175 lb (79.379 kg)  BMI 24.58 kg/m2  SpO2 98%    Objective:   Physical Exam  Constitutional: He is oriented to person, place, and time. He appears well-nourished.  HENT:  Right Ear: Tympanic membrane and ear canal normal.  Left Ear: Tympanic membrane and ear canal normal.  Nose: Nose normal. Right sinus exhibits no maxillary sinus tenderness and no frontal sinus tenderness. Left sinus exhibits no maxillary sinus tenderness and no frontal sinus tenderness.  Mouth/Throat: Oropharynx is clear and moist.  Eyes: Conjunctivae and EOM are normal. Pupils are equal, round, and reactive to light.  Neck: Neck supple. Carotid bruit is not present. No thyromegaly present.  Cardiovascular: Normal rate, regular rhythm and normal heart sounds.   Pulmonary/Chest: Effort normal and breath sounds normal. He has no wheezes. He has no rales.  Abdominal: Soft. Bowel sounds are normal. There is no tenderness.  Genitourinary:  Declines examination, tick bite 2 weeks ago.  Musculoskeletal: Normal range of motion.  Neurological: He is alert and oriented to person, place, and time. He has normal reflexes. No cranial nerve deficit.  Skin: Skin is warm and dry.  Psychiatric: He has a normal mood and affect.          Assessment & Plan:

## 2016-06-23 NOTE — Addendum Note (Signed)
Addended by: Alvina ChouWALSH, Kyiah Canepa J on: 06/23/2016 08:04 AM   Modules accepted: Kipp BroodSmartSet

## 2016-06-23 NOTE — Assessment & Plan Note (Signed)
Immunizations UTD per patient. He leads a healthy lifestyle with good diet. Discussed importance of regular exercise. Exam unremarkable. Labs pending. Has tick bite to scrotum, minor itching, declines examination. Signs and symptoms of Lyme Disease provided to patient as he is concerned. Does not appear to exhibits signs of this.  Follow up in 1 year for repeat physical.

## 2016-06-23 NOTE — Patient Instructions (Signed)
Complete lab work prior to leaving today. I will notify you of your results once received.   Please notify us if you develop a rash, fever, fatigue, headaches, nausea over the next several weeks. Take a look at the information below regarding signs and symptoms of Lyme Disease.  Start exercising. You should be getting 1 hour of moderate intensity exercise 5 days weekly.  Ensure you are consuming 64 ounces of water daily.  Follow up in 1 year for repeat physical or sooner if needed.  It was a pleasure meeting you!  Lyme Disease Lyme disease is an infection that affects many parts of the body, including the skin, joints, and nervous system. CAUSES Lyme disease is caused by bacteria called Borrelia burgdorferi. You can get Lyme disease by being bitten by an infected tick. The tick must be attached to your skin for at least 36 hours to transmit the infection. Deer often carry infected ticks. RISK FACTORS  Living in or visiting [place], the [place], or the [address] [place].  Spending time in wooded or grassy areas.  Being outdoors with exposed skin.  Failing to remove a tick from your skin within 3-4 days. SIGNS AND SYMPTOMS  A round, red rash that comes out from the center of the tick bite. This is the first sign of infection. The center of the rash may be blood colored or have tiny blisters.  Fatigue.  Headache.  Chills and fever.  General achiness.  Joint pain, often in the knee.  Swollen lymph glands. DIAGNOSIS Lyme disease is diagnosed with a medical history, physical exam, and blood test. TREATMENT The main treatment is antibiotic medicine, usually taken by mouth. The length of treatment depends on how soon after a tick bite you begin taking the medicine. In some cases, treatment is necessary for several weeks. If the infection is severe, IV antibiotics may be necessary. HOME CARE INSTRUCTIONS  Take your antibiotic medicine as directed by your health care  provider. Finish the antibiotic even if you start to feel better.  You may take a probiotic in between doses of your antibiotic to help avoid stomach upset or diarrhea.  Check with your health care provider before supplementing your treatment. Many alternative therapies have not been proven and may be harmful to you.  Keep all follow-up visits as directed by your health care provider. This is important. PREVENTION Reinfection is possible with another tick bite by an infected tick. Take these precautions to prevent an infection:  Cover your skin with light-colored clothing when outdoors in the spring and summer months.  Spray clothing and skin with bug spray. The spray should be 20-30% DEET.  Avoided wooded, grassy, and shaded areas.  Remove yard litter, brush, trash, and plants that attract deer and rodents.  Check yourself for ticks when you come indoors.  Wash clothing worn each day.  Check your pets for ticks before they come inside.  If you find a tick:  Remove it with tweezers.  Clean your hands and the bite area with rubbing alcohol or soap and water. Pregnant women should take special care to avoid tick bites because the infection can be passed along to the fetus. SEEK MEDICAL CARE IF:  You have symptoms after treatment.  You have removed a tick and want to bring it to your health care provider for testing. SEEK IMMEDIATE MEDICAL CARE IF:  You have an irregular heartbeat.  You have nerve pain.  Your face feels numb. MAKE SURE YOU:  Understand these instructions.  Will watch your condition.  Will get help right away if you are not doing well or get worse.   This information is not intended to replace advice given to you by your health care provider. Make sure you discuss any questions you have with your health care provider.   Document Released: 03/19/2001 Document Revised: 01/01/2015 Document Reviewed: 04/28/2014 Elsevier Interactive Patient Education [email or web address]  Elsevier Inc[employer]

## 2016-06-23 NOTE — Progress Notes (Signed)
Pre visit review using our clinic review tool, if applicable. No additional management support is needed unless otherwise documented below in the visit note. 

## 2019-12-11 ENCOUNTER — Ambulatory Visit: Payer: Managed Care, Other (non HMO) | Attending: Internal Medicine

## 2019-12-11 DIAGNOSIS — Z20822 Contact with and (suspected) exposure to covid-19: Secondary | ICD-10-CM

## 2019-12-13 LAB — NOVEL CORONAVIRUS, NAA: SARS-CoV-2, NAA: NOT DETECTED

## 2023-09-13 ENCOUNTER — Other Ambulatory Visit: Payer: Self-pay | Admitting: Otolaryngology

## 2023-09-13 DIAGNOSIS — J320 Chronic maxillary sinusitis: Secondary | ICD-10-CM

## 2023-09-17 ENCOUNTER — Other Ambulatory Visit: Payer: Managed Care, Other (non HMO)

## 2023-09-18 ENCOUNTER — Ambulatory Visit
Admission: RE | Admit: 2023-09-18 | Discharge: 2023-09-18 | Disposition: A | Discharge status: Home / Self Care | Payer: Self-pay | Source: Ambulatory Visit | Attending: Otolaryngology | Admitting: Otolaryngology

## 2023-09-18 DIAGNOSIS — J320 Chronic maxillary sinusitis: Secondary | ICD-10-CM

## 2023-10-10 ENCOUNTER — Encounter (INDEPENDENT_AMBULATORY_CARE_PROVIDER_SITE_OTHER): Payer: Self-pay

## 2023-10-10 ENCOUNTER — Ambulatory Visit (INDEPENDENT_AMBULATORY_CARE_PROVIDER_SITE_OTHER): Payer: BC Managed Care – PPO | Admitting: Otolaryngology

## 2023-10-10 VITALS — Ht 72.0 in | Wt 170.0 lb

## 2023-10-10 DIAGNOSIS — J324 Chronic pansinusitis: Secondary | ICD-10-CM

## 2023-10-10 DIAGNOSIS — J342 Deviated nasal septum: Secondary | ICD-10-CM | POA: Diagnosis not present

## 2023-10-10 DIAGNOSIS — J343 Hypertrophy of nasal turbinates: Secondary | ICD-10-CM | POA: Diagnosis not present

## 2023-10-10 DIAGNOSIS — J338 Other polyp of sinus: Secondary | ICD-10-CM | POA: Insufficient documentation

## 2023-10-15 NOTE — Progress Notes (Unsigned)
Patient ID: Luis Richard, male   DOB: [birth year], 52 y.o.   MRN: [id number]  Follow-up: Chronic nasal obstruction, sinonasal polyps  HPI: The patient is a 52 year old male who returns today for his follow-up evaluation.  The patient was previously seen for chronic nasal obstruction and sinonasal polyps.  He was previously treated with multiple courses of antibiotics, systemic steroid, and Flonase nasal sprays.  Despite the treatment, he continues to be symptomatic.  His recent sinus CT scan showed bilateral pansinusitis, with all sinus drainage pathways obstructed by polypoid tissue.  He also has significant nasal septal deviation and bilateral inferior turbinate hypertrophy.  The patient returns today complaining of persistent symptoms.  He is unable to breathe through his nostrils, and with frequent facial pressure.  He is interested in more definitive treatment.  Exam: General: Communicates without difficulty, well nourished, no acute distress. Head: Normocephalic, no evidence injury, no tenderness, facial buttresses intact without stepoff. Face/sinus: No tenderness to palpation and percussion. Facial movement is normal and symmetric. Eyes: PERRL, EOMI. No scleral icterus, conjunctivae clear. Neuro: CN II exam reveals vision grossly intact.  No nystagmus at any point of gaze. Ears: Auricles well formed without lesions.  Ear canals are intact without mass or lesion.  No erythema or edema is appreciated.  The TMs are intact without fluid. Nose: External evaluation reveals normal support and skin without lesions.  Dorsum is intact.  Anterior rhinoscopy reveals congested mucosa over anterior aspect of inferior turbinates and deviated septum.polypoid tissue is noted bilaterally.  Oral:  Oral cavity and oropharynx are intact, symmetric, without erythema or edema.  Mucosa is moist without lesions. Neck: Full range of motion without pain.  There is no significant lymphadenopathy.  No masses palpable.  Thyroid bed  within normal limits to palpation.  Parotid glands and submandibular glands equal bilaterally without mass.  Trachea is midline. Neuro:  CN 2-12 grossly intact.    Assessment: 1.  Bilateral chronic pansinusitis, with sinonasal polyps obstructing all paranasal sinuses and his nasal cavities. 2.  Diffuse nasal mucosal congestion, nasal septal deviation, and bilateral inferior turbinate hypertrophy. 3.  The patient has not responded to medical treatment, including recent high-dose steroids.  More than 95% of his nasal passageways are obstructed bilaterally.  Plan: 1.  The physical exam findings and the CT images are reviewed with the patient. 2.  Continue with Flonase nasal spray 2 sprays each nostril daily. 3.  Based on the above findings, the patient will benefit from surgical intervention with bilateral endoscopic sinus surgery, septoplasty, and turbinate reduction.  The risk, benefits, and details of the procedures are extensively reviewed.  Questions were invited and answered. 4.  The patient would like to proceed with the procedures.

## 2023-11-08 ENCOUNTER — Other Ambulatory Visit (INDEPENDENT_AMBULATORY_CARE_PROVIDER_SITE_OTHER): Payer: Self-pay | Admitting: Otolaryngology

## 2023-11-08 DIAGNOSIS — J3489 Other specified disorders of nose and nasal sinuses: Secondary | ICD-10-CM | POA: Diagnosis not present

## 2023-11-08 DIAGNOSIS — J343 Hypertrophy of nasal turbinates: Secondary | ICD-10-CM | POA: Diagnosis not present

## 2023-11-08 DIAGNOSIS — J338 Other polyp of sinus: Secondary | ICD-10-CM | POA: Diagnosis not present

## 2023-11-08 DIAGNOSIS — J342 Deviated nasal septum: Secondary | ICD-10-CM | POA: Diagnosis not present

## 2023-11-08 DIAGNOSIS — J324 Chronic pansinusitis: Secondary | ICD-10-CM | POA: Diagnosis not present

## 2023-11-08 MED ORDER — OXYCODONE-ACETAMINOPHEN 5-325 MG PO TABS
1.0000 | ORAL_TABLET | ORAL | 0 refills | Status: AC | PRN
Start: 2023-11-08 — End: 2023-11-10

## 2023-11-08 MED ORDER — AMOXICILLIN 875 MG PO TABS: 875.0000 mg | ORAL_TABLET | Freq: Two times a day (BID) | ORAL | 0 refills | Status: AC

## 2023-11-08 MED ORDER — OXYCODONE-ACETAMINOPHEN 5-325 MG PO TABS: 1.0000 | ORAL_TABLET | ORAL | 0 refills | Status: AC | PRN

## 2023-11-12 ENCOUNTER — Encounter (INDEPENDENT_AMBULATORY_CARE_PROVIDER_SITE_OTHER): Payer: Self-pay

## 2023-11-12 ENCOUNTER — Ambulatory Visit (INDEPENDENT_AMBULATORY_CARE_PROVIDER_SITE_OTHER): Payer: BC Managed Care – PPO | Admitting: Otolaryngology

## 2023-11-12 VITALS — Ht 72.0 in | Wt 170.0 lb

## 2023-11-12 DIAGNOSIS — J324 Chronic pansinusitis: Secondary | ICD-10-CM

## 2023-11-12 DIAGNOSIS — R0981 Nasal congestion: Secondary | ICD-10-CM

## 2023-11-12 DIAGNOSIS — J3489 Other specified disorders of nose and nasal sinuses: Secondary | ICD-10-CM

## 2023-11-12 DIAGNOSIS — J338 Other polyp of sinus: Secondary | ICD-10-CM

## 2023-11-13 NOTE — Progress Notes (Signed)
Patient ID: Luis Richard, male   DOB: 1971/06/27, 52 y.o.   MRN: [id number]  Procedure: Nasal/sinus debridement status post endoscopic sinus surgery.  Indication: The patient previously underwent bilateral endoscopic sinus surgery to treat his chronic bilateral pansinusitis and polyposis.  The patient returns today complaining of significant nasal congestion and crusting.  He had a moderate amount of bleeding from the nasal cavities.  Currently the patient denies any facial pain, fever, or visual change.  Anesthesia: Topical Xylocaine and Neo-Synephrine.  Description: The patient is placed upright in the exam chair. Both nasal cavities are sprayed with topical Xylocaine and Neo-Synephrine.  A 0 rigid endoscope is used for the examination. The scope is first advanced past the right nostril into the right nasal cavity. A significant amount of crusting and blood clots are noted within the right nasal cavity and the maxillary/ethmoid cavities.  The crusting and blood clots are removed with suction catheters and alligator forceps, which are inserted in parallel with the rigid endoscope.  After the debridement procedure, the maxillary antrum and the ethmoid cavities are noted to be widely patent.  The frontal and sphenoid openings are also patent.  The same procedure is then repeated on the left side without exception. Similar findings are again noted on the left. The patient tolerated the procedure well.  Follow up care: The patient is instructed to perform daily nasal saline irrigation.  The patient will return for re-evaluation in approximately 3 weeks.

## 2023-11-30 ENCOUNTER — Ambulatory Visit (INDEPENDENT_AMBULATORY_CARE_PROVIDER_SITE_OTHER): Payer: BC Managed Care – PPO

## 2023-11-30 ENCOUNTER — Encounter (INDEPENDENT_AMBULATORY_CARE_PROVIDER_SITE_OTHER): Payer: Self-pay

## 2023-11-30 VITALS — Ht 72.0 in | Wt 168.0 lb

## 2023-11-30 DIAGNOSIS — J338 Other polyp of sinus: Secondary | ICD-10-CM

## 2023-11-30 DIAGNOSIS — R0981 Nasal congestion: Secondary | ICD-10-CM | POA: Diagnosis not present

## 2023-11-30 DIAGNOSIS — J3489 Other specified disorders of nose and nasal sinuses: Secondary | ICD-10-CM

## 2023-11-30 DIAGNOSIS — J324 Chronic pansinusitis: Secondary | ICD-10-CM

## 2023-12-01 NOTE — Progress Notes (Signed)
Patient ID: Luis Richard, male   DOB: 1971/06/03, 52 y.o.   MRN: [id number]  Procedure: Bilateral nasal/sinus debridement status post endoscopic sinus surgery.   Indication: The patient previously underwent bilateral endoscopic sinus surgery to treat his bilateral chronic pansinusitis and polyposis.  The patient returns today reporting improvement in his nasal congestion and crusting.  He had no significant bleeding from the nasal cavities.  Currently the patient denies any facial pain, fever, or visual change.   Anesthesia: Topical Xylocaine and Neo-Synephrine.   Description: The patient is placed upright in the exam chair. Both nasal cavities are sprayed with topical Xylocaine and Neo-Synephrine.  A 0 rigid endoscope is used for the examination. The scope is first advanced past the right nostril into the right nasal cavity. A moderate amount of crusting is noted within the right nasal cavity and the maxillary/ethmoid cavities.  The crusting is removed with suction catheters and alligator forceps, which are inserted in parallel with the rigid endoscope.  After the debridement procedure, the maxillary antrum and the ethmoid cavities are noted to be widely patent.  The frontal and sphenoid openings are also patent.  The same procedure is then repeated on the left side without exception. Similar findings are again noted on the left. The patient tolerated the procedure well.   Follow up care: The patient is instructed to continue daily nasal saline irrigation.  The patient will return for re-evaluation in approximately 3 weeks.

## 2023-12-25 ENCOUNTER — Ambulatory Visit (INDEPENDENT_AMBULATORY_CARE_PROVIDER_SITE_OTHER): Payer: BC Managed Care – PPO

## 2023-12-25 ENCOUNTER — Encounter (INDEPENDENT_AMBULATORY_CARE_PROVIDER_SITE_OTHER): Payer: Self-pay

## 2023-12-25 VITALS — BP 121/70 | HR 64 | Ht 72.0 in | Wt 170.0 lb

## 2023-12-25 DIAGNOSIS — J3489 Other specified disorders of nose and nasal sinuses: Secondary | ICD-10-CM | POA: Diagnosis not present

## 2023-12-25 DIAGNOSIS — R0981 Nasal congestion: Secondary | ICD-10-CM | POA: Diagnosis not present

## 2023-12-25 DIAGNOSIS — J338 Other polyp of sinus: Secondary | ICD-10-CM

## 2023-12-25 DIAGNOSIS — J324 Chronic pansinusitis: Secondary | ICD-10-CM

## 2023-12-26 NOTE — Progress Notes (Signed)
 Patient ID: Luis Richard, male   DOB: 05/15/1971, 53 y.o.   MRN: [id number]  Procedure: Bilateral nasal/sinus debridement status post endoscopic sinus surgery.   Indication: The patient underwent bilateral endoscopic sinus surgery to treat his bilateral chronic pansinusitis and polyposis on November 08, 2023.  The patient returns today reporting continuing improvement in his nasal congestion and crusting.  His nasal breathing has improved.  He denies any facial pain, pressure, or bleeding.     Anesthesia: Topical Xylocaine  and Neo-Synephrine.   Description: The patient is placed upright in the exam chair. Both nasal cavities are sprayed with topical Xylocaine  and Neo-Synephrine.  A 0 rigid endoscope is used for the examination. The scope is first advanced past the right nostril into the right nasal cavity. A small amount of crusting is noted within the right nasal cavity and the maxillary/ethmoid cavities.  The crusting is removed with suction catheters and alligator forceps, which are inserted in parallel with the rigid endoscope.  After the debridement procedure, the maxillary antrum and the ethmoid cavities are noted to be widely patent.  The frontal and sphenoid openings are also patent.  The same procedure is then repeated on the left side without exception. Similar findings are again noted on the left. The patient tolerated the procedure well.   Follow up care: The patient is instructed to continue as needed nasal saline irrigation.  The patient will return for re-evaluation in approximately 6 months.

## 2024-05-27 ENCOUNTER — Ambulatory Visit (INDEPENDENT_AMBULATORY_CARE_PROVIDER_SITE_OTHER): Payer: BC Managed Care – PPO

## 2024-06-05 ENCOUNTER — Ambulatory Visit (INDEPENDENT_AMBULATORY_CARE_PROVIDER_SITE_OTHER): Admitting: Otolaryngology

## 2024-06-05 ENCOUNTER — Encounter (INDEPENDENT_AMBULATORY_CARE_PROVIDER_SITE_OTHER): Payer: Self-pay | Admitting: Otolaryngology

## 2024-06-05 VITALS — BP 128/78 | HR 70 | Ht 72.0 in | Wt 170.0 lb

## 2024-06-05 DIAGNOSIS — Z8669 Personal history of other diseases of the nervous system and sense organs: Secondary | ICD-10-CM | POA: Diagnosis not present

## 2024-06-05 DIAGNOSIS — J310 Chronic rhinitis: Secondary | ICD-10-CM

## 2024-06-05 DIAGNOSIS — R0981 Nasal congestion: Secondary | ICD-10-CM | POA: Diagnosis not present

## 2024-06-05 DIAGNOSIS — J343 Hypertrophy of nasal turbinates: Secondary | ICD-10-CM

## 2024-06-05 DIAGNOSIS — J324 Chronic pansinusitis: Secondary | ICD-10-CM

## 2024-06-05 DIAGNOSIS — Z09 Encounter for follow-up examination after completed treatment for conditions other than malignant neoplasm: Secondary | ICD-10-CM | POA: Diagnosis not present

## 2024-06-05 DIAGNOSIS — J338 Other polyp of sinus: Secondary | ICD-10-CM

## 2024-06-05 NOTE — Progress Notes (Signed)
 Patient ID: Luis Richard, male   DOB: [birth year], 53 y.o.   MRN: [id number]  Follow-up: Chronic nasal congestion, chronic rhinosinusitis and polyposis  HPI: The patient is a 53 year old male who returns today for his follow-up evaluation.  The patient has a history of chronic nasal obstruction, bilateral chronic pansinusitis, and sinonasal polyps.  He underwent bilateral endoscopic sinus surgery, septoplasty, and bilateral turbinate reduction in November 2024.  The patient returns today reporting significant improvement in his nasal breathing.  He has not had any recent sinusitis.  He has mild nasal congestion during the allergy season.  Currently he denies any facial pain, fever, or visual change.  Exam: General: Communicates without difficulty, well nourished, no acute distress. Head: Normocephalic, no evidence injury, no tenderness, facial buttresses intact without stepoff. Face/sinus: No tenderness to palpation and percussion. Facial movement is normal and symmetric. Eyes: PERRL, EOMI. No scleral icterus, conjunctivae clear. Neuro: CN II exam reveals vision grossly intact.  No nystagmus at any point of gaze. Ears: Auricles well formed without lesions.  Ear canals are intact without mass or lesion.  No erythema or edema is appreciated.  The TMs are intact without fluid. Nose: External evaluation reveals normal support and skin without lesions.  Dorsum is intact.  Anterior rhinoscopy reveals congested mucosa over anterior aspect of inferior turbinates and intact septum.  No purulence noted. Oral:  Oral cavity and oropharynx are intact, symmetric, without erythema or edema.  Mucosa is moist without lesions. Neck: Full range of motion without pain.  There is no significant lymphadenopathy.  No masses palpable.  Thyroid bed within normal limits to palpation.  Parotid glands and submandibular glands equal bilaterally without mass.  Trachea is midline. Neuro:  CN 2-12 grossly intact.   Assessment: 1.   Chronic rhinitis with mild nasal mucosal congestion. 2.  His septum and turbinates are well-healed.  His nasal passageways are patent. 3.  History of chronic pansinusitis and polyposis.  He is currently doing well status post bilateral endoscopic sinus surgery.  Plan: 1.  The physical exam findings are reviewed with the patient. 2.  Continue with Flonase nasal spray 2 sprays each nostril daily. 3.  Nasal saline irrigation is encouraged. 4.  The patient will return for reevaluation in 6 months.

## 2024-12-04 ENCOUNTER — Ambulatory Visit (INDEPENDENT_AMBULATORY_CARE_PROVIDER_SITE_OTHER): Admitting: Otolaryngology

## 2025-01-19 ENCOUNTER — Ambulatory Visit (INDEPENDENT_AMBULATORY_CARE_PROVIDER_SITE_OTHER): Admitting: Otolaryngology

## 2025-01-27 ENCOUNTER — Ambulatory Visit (INDEPENDENT_AMBULATORY_CARE_PROVIDER_SITE_OTHER): Admitting: Otolaryngology

## 2025-01-27 ENCOUNTER — Encounter (INDEPENDENT_AMBULATORY_CARE_PROVIDER_SITE_OTHER): Payer: Self-pay | Admitting: Otolaryngology

## 2025-01-27 VITALS — BP 105/68 | HR 80 | Ht 72.0 in | Wt 175.0 lb

## 2025-01-27 DIAGNOSIS — J310 Chronic rhinitis: Secondary | ICD-10-CM

## 2025-01-27 DIAGNOSIS — R0981 Nasal congestion: Secondary | ICD-10-CM | POA: Diagnosis not present

## 2025-01-27 DIAGNOSIS — J324 Chronic pansinusitis: Secondary | ICD-10-CM

## 2025-01-27 DIAGNOSIS — J338 Other polyp of sinus: Secondary | ICD-10-CM

## 2025-01-27 NOTE — Progress Notes (Signed)
 Patient ID: Luis Richard, male   DOB: 11-30-1971, 54 y.o.   MRN: [id number]  Follow-up: Chronic nasal congestion, chronic rhinosinusitis and polyposis  HPI: The patient is a 54 year old male who returns today for his follow-up evaluation.  The patient has a history of chronic nasal obstruction, bilateral chronic pansinusitis, and sinonasal polyps.  He underwent bilateral endoscopic sinus surgery, septoplasty, and bilateral turbinate reduction in November 2024.  The patient returns today reporting continuing improvement in his nasal breathing.  He has not had any recent sinusitis.  He has mild nasal congestion during the allergy season.  Currently he denies any facial pain, fever, or visual change.  Exam: General: Communicates without difficulty, well nourished, no acute distress. Head: Normocephalic, no evidence injury, no tenderness, facial buttresses intact without stepoff. Face/sinus: No tenderness to palpation and percussion. Facial movement is normal and symmetric. Eyes: PERRL, EOMI. No scleral icterus, conjunctivae clear. Neuro: CN II exam reveals vision grossly intact.  No nystagmus at any point of gaze. Ears: Auricles well formed without lesions.  Ear canals are intact without mass or lesion.  No erythema or edema is appreciated.  The TMs are intact without fluid. Nose: External evaluation reveals normal support and skin without lesions.  Dorsum is intact.  Anterior rhinoscopy reveals congested mucosa over anterior aspect of inferior turbinates and intact septum.  No purulence noted. Oral:  Oral cavity and oropharynx are intact, symmetric, without erythema or edema.  Mucosa is moist without lesions. Neck: Full range of motion without pain.  There is no significant lymphadenopathy.  No masses palpable.  Thyroid bed within normal limits to palpation.  Parotid glands and submandibular glands equal bilaterally without mass.  Trachea is midline. Neuro:  CN 2-12 grossly intact.   Assessment: 1.  Chronic  rhinitis with mild nasal mucosal congestion. 2.  His septum and turbinates are well-healed.  His nasal passageways are patent. 3.  History of chronic pansinusitis and polyposis.  He is currently doing well status post bilateral endoscopic sinus surgery.  Plan: 1.  The physical exam findings are reviewed with the patient. 2.  Continue with Flonase nasal spray 2 sprays each nostril daily.  The importance of consistent daily use is discussed. 3.  Nasal saline irrigation is encouraged. 4.  The patient will return for reevaluation in 6 months.

## 2025-08-11 ENCOUNTER — Ambulatory Visit (INDEPENDENT_AMBULATORY_CARE_PROVIDER_SITE_OTHER): Admitting: Otolaryngology
